# Patient Record
Sex: Female | Born: 1980 | Race: White | Hispanic: No | Marital: Married | Smoking: Never smoker
Health system: Southern US, Community
[De-identification: ages and names within clinical notes are randomized; demographics above are authoritative.]

## PROBLEM LIST (undated history)

## (undated) DIAGNOSIS — K130 Diseases of lips: Secondary | ICD-10-CM

## (undated) DIAGNOSIS — M549 Dorsalgia, unspecified: Secondary | ICD-10-CM

## (undated) DIAGNOSIS — N200 Calculus of kidney: Secondary | ICD-10-CM

## (undated) DIAGNOSIS — Z8669 Personal history of other diseases of the nervous system and sense organs: Secondary | ICD-10-CM

## (undated) HISTORY — DX: Calculus of kidney: N20.0

## (undated) HISTORY — PX: PARTIAL HYSTERECTOMY: SHX80

## (undated) HISTORY — DX: Dorsalgia, unspecified: M54.9

## (undated) HISTORY — DX: Diseases of lips: K13.0

## (undated) HISTORY — PX: ABDOMINAL HYSTERECTOMY: SHX81

## (undated) HISTORY — PX: BREAST ENHANCEMENT SURGERY: SHX7

## (undated) HISTORY — DX: Personal history of other diseases of the nervous system and sense organs: Z86.69

---

## 2009-12-03 ENCOUNTER — Encounter (INDEPENDENT_AMBULATORY_CARE_PROVIDER_SITE_OTHER): Payer: Self-pay | Admitting: Obstetrics and Gynecology

## 2009-12-03 ENCOUNTER — Observation Stay (HOSPITAL_COMMUNITY): Admission: RE | Admit: 2009-12-03 | Discharge: 2009-12-04 | Payer: Self-pay | Admitting: Obstetrics and Gynecology

## 2010-01-20 ENCOUNTER — Encounter: Admission: RE | Admit: 2010-01-20 | Discharge: 2010-01-20 | Payer: Self-pay | Admitting: Obstetrics and Gynecology

## 2010-08-24 LAB — BASIC METABOLIC PANEL
CO2: 29 mEq/L (ref 19–32)
Chloride: 106 mEq/L (ref 96–112)
GFR calc Af Amer: >60 mL/min (ref >60)
GFR calc non Af Amer: >60 mL/min (ref >60)
Glucose, Bld: 92 mg/dL (ref 70–99)
Sodium: 139 mEq/L (ref 135–145)

## 2010-08-24 LAB — CBC
HCT: 28.8 % — ABNORMAL LOW (ref 36.0–46.0)
Hemoglobin: 9.8 g/dL — ABNORMAL LOW (ref 12.0–15.0)
MCH: 27 pg (ref 26.0–34.0)
MCHC: 34.2 g/dL (ref 30.0–36.0)
MCV: 79.6 fL (ref 78.0–100.0)

## 2010-08-24 LAB — CREATININE, SERUM
Creatinine, Ser: 0.61 mg/dL (ref 0.4–1.2)
GFR calc Af Amer: >60 mL/min (ref >60)
GFR calc non Af Amer: >60 mL/min (ref >60)

## 2010-08-24 LAB — SURGICAL PCR SCREEN: Staphylococcus aureus: NEGATIVE

## 2010-08-24 LAB — SAMPLE TO BLOOD BANK

## 2019-03-23 ENCOUNTER — Other Ambulatory Visit: Payer: Self-pay | Admitting: Internal Medicine

## 2019-03-23 DIAGNOSIS — M545 Low back pain, unspecified: Secondary | ICD-10-CM

## 2019-03-30 ENCOUNTER — Ambulatory Visit
Admission: RE | Admit: 2019-03-30 | Discharge: 2019-03-30 | Disposition: A | Discharge status: Home / Self Care | Payer: BC Managed Care – PPO | Source: Ambulatory Visit | Attending: Internal Medicine | Admitting: Internal Medicine

## 2019-03-30 ENCOUNTER — Other Ambulatory Visit: Payer: Self-pay

## 2019-03-30 DIAGNOSIS — M545 Low back pain, unspecified: Secondary | ICD-10-CM

## 2019-09-04 ENCOUNTER — Other Ambulatory Visit: Payer: Self-pay | Admitting: Orthopedic Surgery

## 2019-09-04 DIAGNOSIS — M259 Joint disorder, unspecified: Secondary | ICD-10-CM

## 2019-09-18 ENCOUNTER — Other Ambulatory Visit: Payer: Self-pay

## 2019-09-18 ENCOUNTER — Ambulatory Visit
Admission: RE | Admit: 2019-09-18 | Discharge: 2019-09-18 | Disposition: A | Discharge status: Home / Self Care | Payer: BC Managed Care – PPO | Source: Ambulatory Visit | Attending: Orthopedic Surgery | Admitting: Orthopedic Surgery

## 2019-09-18 DIAGNOSIS — M259 Joint disorder, unspecified: Secondary | ICD-10-CM

## 2019-11-15 ENCOUNTER — Other Ambulatory Visit: Payer: Self-pay | Admitting: Orthopedic Surgery

## 2019-11-15 DIAGNOSIS — M259 Joint disorder, unspecified: Secondary | ICD-10-CM

## 2019-12-01 ENCOUNTER — Ambulatory Visit
Admission: RE | Admit: 2019-12-01 | Discharge: 2019-12-01 | Disposition: A | Discharge status: Home / Self Care | Payer: BC Managed Care – PPO | Source: Ambulatory Visit | Attending: Orthopedic Surgery | Admitting: Orthopedic Surgery

## 2019-12-01 ENCOUNTER — Other Ambulatory Visit: Payer: Self-pay

## 2019-12-01 DIAGNOSIS — M259 Joint disorder, unspecified: Secondary | ICD-10-CM

## 2019-12-01 MED ORDER — METHYLPREDNISOLONE ACETATE 40 MG/ML INJ SUSP (RADIOLOG: 120.0000 mg | Freq: Once | INTRAMUSCULAR | Status: DC

## 2020-03-12 ENCOUNTER — Other Ambulatory Visit: Payer: Self-pay | Admitting: Orthopedic Surgery

## 2020-03-12 DIAGNOSIS — G8929 Other chronic pain: Secondary | ICD-10-CM

## 2020-03-19 ENCOUNTER — Other Ambulatory Visit: Payer: Self-pay

## 2020-03-19 ENCOUNTER — Ambulatory Visit
Admission: RE | Admit: 2020-03-19 | Discharge: 2020-03-19 | Disposition: A | Discharge status: Home / Self Care | Payer: BC Managed Care – PPO | Source: Ambulatory Visit | Attending: Orthopedic Surgery | Admitting: Orthopedic Surgery

## 2020-03-19 DIAGNOSIS — M545 Low back pain, unspecified: Secondary | ICD-10-CM

## 2020-03-19 DIAGNOSIS — G8929 Other chronic pain: Secondary | ICD-10-CM

## 2020-03-19 MED ORDER — IOPAMIDOL (ISOVUE-M 200) INJECTION 41%: 1.0000 mL | Freq: Once | INTRAMUSCULAR | Status: DC

## 2020-03-19 MED ORDER — METHYLPREDNISOLONE ACETATE 40 MG/ML INJ SUSP (RADIOLOG: 60.0000 mg | Freq: Once | INTRAMUSCULAR | Status: DC

## 2020-03-19 MED ORDER — CEFAZOLIN SODIUM-DEXTROSE 2-4 GM/100ML-% IV SOLN
2.0000 g | Freq: Once | INTRAVENOUS | Status: AC
  Administered 2020-03-19: 2 g via INTRAVENOUS

## 2020-03-19 NOTE — Discharge Instructions (Signed)

## 2021-09-03 ENCOUNTER — Ambulatory Visit
Admission: RE | Admit: 2021-09-03 | Discharge: 2021-09-03 | Disposition: A | Discharge status: Home / Self Care | Payer: BC Managed Care – PPO | Source: Ambulatory Visit | Attending: Physical Medicine and Rehabilitation | Admitting: Physical Medicine and Rehabilitation

## 2021-09-03 ENCOUNTER — Other Ambulatory Visit: Payer: Self-pay | Admitting: Physical Medicine and Rehabilitation

## 2021-09-03 DIAGNOSIS — T1490XA Injury, unspecified, initial encounter: Secondary | ICD-10-CM

## 2022-01-10 ENCOUNTER — Other Ambulatory Visit: Payer: Self-pay

## 2022-01-10 ENCOUNTER — Encounter (HOSPITAL_BASED_OUTPATIENT_CLINIC_OR_DEPARTMENT_OTHER): Payer: Self-pay

## 2022-01-10 ENCOUNTER — Emergency Department (HOSPITAL_BASED_OUTPATIENT_CLINIC_OR_DEPARTMENT_OTHER)
Admission: EM | Admit: 2022-01-10 | Discharge: 2022-01-11 | Disposition: A | Discharge status: Home / Self Care | Payer: BC Managed Care – PPO | Attending: Emergency Medicine | Admitting: Emergency Medicine

## 2022-01-10 DIAGNOSIS — M549 Dorsalgia, unspecified: Secondary | ICD-10-CM | POA: Diagnosis not present

## 2022-01-10 DIAGNOSIS — R110 Nausea: Secondary | ICD-10-CM | POA: Insufficient documentation

## 2022-01-10 DIAGNOSIS — R1013 Epigastric pain: Secondary | ICD-10-CM | POA: Insufficient documentation

## 2022-01-10 DIAGNOSIS — R109 Unspecified abdominal pain: Secondary | ICD-10-CM | POA: Diagnosis present

## 2022-01-10 LAB — URINALYSIS, ROUTINE W REFLEX MICROSCOPIC
Bilirubin Urine: NEGATIVE
Glucose, UA: NEGATIVE mg/dL
Ketones, ur: 15 mg/dL — AB
Leukocytes,Ua: NEGATIVE
Nitrite: NEGATIVE
Protein, ur: NEGATIVE mg/dL
Specific Gravity, Urine: 1.013 (ref 1.005–1.030)
pH: 6 (ref 5.0–8.0)

## 2022-01-10 LAB — CBC
HCT: 38.8 % (ref 36.0–46.0)
Hemoglobin: 12.8 g/dL (ref 12.0–15.0)
MCH: 27.9 pg (ref 26.0–34.0)
MCHC: 33 g/dL (ref 30.0–36.0)
MCV: 84.7 fL (ref 80.0–100.0)
Platelets: 348 10*3/uL (ref 150–400)
RBC: 4.58 MIL/uL (ref 3.87–5.11)
RDW: 12.3 % (ref 11.5–15.5)
WBC: 8.9 10*3/uL (ref 4.0–10.5)
nRBC: 0 % (ref 0.0–0.2)

## 2022-01-10 LAB — COMPREHENSIVE METABOLIC PANEL
ALT: 16 U/L (ref 0–44)
AST: 15 U/L (ref 15–41)
Albumin: 4.3 g/dL (ref 3.5–5.0)
Alkaline Phosphatase: 62 U/L (ref 38–126)
Anion gap: 12 (ref 5–15)
BUN: 8 mg/dL (ref 6–20)
CO2: 25 mmol/L (ref 22–32)
Calcium: 9.7 mg/dL (ref 8.9–10.3)
Chloride: 101 mmol/L (ref 98–111)
Creatinine, Ser: 0.54 mg/dL (ref 0.44–1.00)
GFR, Estimated: >60 mL/min (ref >60)
Glucose, Bld: 83 mg/dL (ref 70–99)
Potassium: 3.7 mmol/L (ref 3.5–5.1)
Sodium: 138 mmol/L (ref 135–145)
Total Bilirubin: 0.4 mg/dL (ref 0.3–1.2)
Total Protein: 7.9 g/dL (ref 6.5–8.1)

## 2022-01-10 LAB — LIPASE, BLOOD: Lipase: 29 U/L (ref 11–51)

## 2022-01-10 MED ORDER — FENTANYL CITRATE PF 50 MCG/ML IJ SOSY
50.0000 ug | PREFILLED_SYRINGE | Freq: Once | INTRAMUSCULAR | Status: AC
  Administered 2022-01-11: 50 ug via INTRAVENOUS
  Filled 2022-01-10: qty 1

## 2022-01-10 MED ORDER — ONDANSETRON HCL 4 MG/2ML IJ SOLN
4.0000 mg | Freq: Once | INTRAMUSCULAR | Status: AC
  Administered 2022-01-11: 4 mg via INTRAVENOUS
  Filled 2022-01-10: qty 2

## 2022-01-10 NOTE — ED Triage Notes (Signed)
Pt presents to the ED with mid to upper abdominal pain x7 days. Reports nausea. Denies vomiting or diarrhea. Reports recently getting back from [place] where she spent two months.

## 2022-01-10 NOTE — ED Provider Notes (Signed)
[facility] Provider Note: [name], MD, FACEP  CSN: [id number] MRN: [id number] ARRIVAL: 01/10/22 at 1930 ROOM: DB014/DB014   CHIEF COMPLAINT  Abdominal Pain   HISTORY OF PRESENT ILLNESS  01/10/22 11:47 PM Cindy Harmon is a 41 y.o. female with 1 week of mid upper abdominal pain which she rates as a 6 out of 10.  She has had associated nausea but no vomiting or diarrhea.  She recently returned from [place] where she is spent 2 months.  She is also having pain in her back due to a known disc problem.   History reviewed. No pertinent past medical history.  Past Surgical History:  Procedure Laterality Date   ABDOMINAL HYSTERECTOMY      No family history on file.  Social History   Tobacco Use   Smoking status: Never   Smokeless tobacco: Never  Substance Use Topics   Alcohol use: Never   Drug use: Never    Prior to Admission medications   Medication Sig Start Date End Date Taking? Authorizing Provider  ondansetron (ZOFRAN) 8 MG tablet Take 1 tablet (8 mg total) by mouth every 8 (eight) hours as needed for nausea or vomiting. 01/11/22  Yes Andri Prestia, MD  pantoprazole (PROTONIX) 40 MG tablet Take 1 tablet every morning at least 30 minutes before first dose of Carafate. 01/11/22  Yes Cresencia Asmus, MD  sucralfate (CARAFATE) 1 g tablet Take 1 tablet (1 g total) by mouth 4 (four) times daily -  with meals and at bedtime. 01/11/22  Yes Moksh Loomer, MD    Allergies Pollen extract   REVIEW OF SYSTEMS  Negative except as noted here or in the History of Present Illness.   PHYSICAL EXAMINATION  Initial Vital Signs Blood pressure 114/80, pulse 79, temperature 98.3 F (36.8 C), temperature source Oral, resp. rate 20, height 4\' 11"  (1.499 m), weight 49.9 kg, SpO2 96 %.  Examination General: Well-developed, well-nourished female in no acute distress; appearance consistent with age of record HENT: normocephalic; atraumatic Eyes: Normal appearance Neck:  supple Heart: regular rate and rhythm Lungs: clear to auscultation bilaterally Abdomen: soft; nondistended; epigastric and suprapubic tenderness; bowel sounds present Extremities: No deformity; full range of motion; pulses normal Neurologic: Awake, alert and oriented; motor function intact in all extremities and symmetric; no facial droop Skin: Warm and dry Psychiatric: Normal mood and affect   RESULTS  Summary of this visit's results, reviewed and interpreted by myself:   EKG Interpretation  Date/Time:    Ventricular Rate:    PR Interval:    QRS Duration:   QT Interval:    QTC Calculation:   R Axis:     Text Interpretation:         Laboratory Studies: Results for orders placed or performed during the hospital encounter of 01/10/22 (from the past 24 hour(s))  Lipase, blood     Status: None   Collection Time: 01/10/22  7:51 PM  Result Value Ref Range   Lipase 29 11 - 51 U/L  Comprehensive metabolic panel     Status: None   Collection Time: 01/10/22  7:51 PM  Result Value Ref Range   Sodium 138 135 - 145 mmol/L   Potassium 3.7 3.5 - 5.1 mmol/L   Chloride 101 98 - 111 mmol/L   CO2 25 22 - 32 mmol/L   Glucose, Bld 83 70 - 99 mg/dL   BUN 8 6 - 20 mg/dL   Creatinine, Ser 03/12/22 0.44 - 1.00 mg/dL   Calcium  9.7 8.9 - 10.3 mg/dL   Total Protein 7.9 6.5 - 8.1 g/dL   Albumin 4.3 3.5 - 5.0 g/dL   AST 15 15 - 41 U/L   ALT 16 0 - 44 U/L   Alkaline Phosphatase 62 38 - 126 U/L   Total Bilirubin 0.4 0.3 - 1.2 mg/dL   GFR, Estimated >53 >64 mL/min   Anion gap 12 5 - 15  CBC     Status: None   Collection Time: 01/10/22  7:51 PM  Result Value Ref Range   WBC 8.9 4.0 - 10.5 K/uL   RBC 4.58 3.87 - 5.11 MIL/uL   Hemoglobin 12.8 12.0 - 15.0 g/dL   HCT 68.0 32.1 - 22.4 %   MCV 84.7 80.0 - 100.0 fL   MCH 27.9 26.0 - 34.0 pg   MCHC 33.0 30.0 - 36.0 g/dL   RDW 82.5 00.3 - 70.4 %   Platelets 348 150 - 400 K/uL   nRBC 0.0 0.0 - 0.2 %  Urinalysis, Routine w reflex microscopic      Status: Abnormal   Collection Time: 01/10/22  9:00 PM  Result Value Ref Range   Color, Urine COLORLESS (A) YELLOW   APPearance CLEAR CLEAR   Specific Gravity, Urine 1.013 1.005 - 1.030   pH 6.0 5.0 - 8.0   Glucose, UA NEGATIVE NEGATIVE mg/dL   Hgb urine dipstick SMALL (A) NEGATIVE   Bilirubin Urine NEGATIVE NEGATIVE   Ketones, ur 15 (A) NEGATIVE mg/dL   Protein, ur NEGATIVE NEGATIVE mg/dL   Nitrite NEGATIVE NEGATIVE   Leukocytes,Ua NEGATIVE NEGATIVE   RBC / HPF 0-5 0 - 5 RBC/hpf   WBC, UA 0-5 0 - 5 WBC/hpf   Squamous Epithelial / LPF 0-5 0 - 5   Mucus PRESENT    Imaging Studies: CT ABDOMEN PELVIS W CONTRAST  Result Date: 01/11/2022 CLINICAL DATA:  Acute abdominal pain. EXAM: CT ABDOMEN AND PELVIS WITH CONTRAST TECHNIQUE: Multidetector CT imaging of the abdomen and pelvis was performed using the standard protocol following bolus administration of intravenous contrast. RADIATION DOSE REDUCTION: This exam was performed according to the departmental dose-optimization program which includes automated exposure control, adjustment of the mA and/or kV according to patient size and/or use of iterative reconstruction technique. CONTRAST:  82mL OMNIPAQUE IOHEXOL 300 MG/ML  SOLN COMPARISON:  Remote noncontrast CT 01/20/2010 FINDINGS: Lower chest: Clear lung bases. Hepatobiliary: Tiny subcentimeter hypodensities in the dome of the liver are too small to characterize. No suspicious liver lesion. Physiologically distended gallbladder without calcified gallstone. Upper normal common bile duct at 7 mm. No visible choledocholithiasis. There is no intrahepatic biliary ductal dilatation. Pancreas: No ductal dilatation or inflammation. Spleen: Normal in size without focal abnormality. Adrenals/Urinary Tract: Normal adrenal glands. No hydronephrosis. There are 3 nonobstructing stones in the left kidney. No definite right renal calculi. No focal renal lesion. No renal inflammation. Unremarkable urinary bladder.  Stomach/Bowel: Decompressed stomach. No small bowel obstruction or inflammation. Normal appendix. Moderate volume of stool in the ascending and transverse colon. The descending colon is decompressed, equivocal mild pericolonic edema. Formed stool in the sigmoid. Vascular/Lymphatic: No acute vascular findings. Normal caliber abdominal aorta. Patent portal vein. No abdominopelvic adenopathy. Reproductive: Hysterectomy.  Quiescent ovaries, no adnexal mass. Other: No free air, free fluid, or intra-abdominal fluid collection. Musculoskeletal: There are no acute or suspicious osseous abnormalities. IMPRESSION: 1. Equivocal mild pericolonic edema of the descending colon, query mild colitis. 2. Nonobstructing left nephrolithiasis. Electronically Signed   By: Ivette Loyal.D.  On: 01/11/2022 00:54    ED COURSE and MDM  Nursing notes, initial and subsequent vitals signs, including pulse oximetry, reviewed and interpreted by myself.  Vitals:   01/10/22 2300 01/10/22 2330 01/11/22 0000 01/11/22 0028  BP: 113/80 90/64 98/63    Pulse: 86 77 77   Resp:      Temp:    98.5 F (36.9 C)  TempSrc:      SpO2: 98% 98% 96%   Weight:      Height:       Medications  pantoprazole (PROTONIX) injection 40 mg (has no administration in time range)  sucralfate (CARAFATE) 1 GM/10ML suspension 1 g (has no administration in time range)  ondansetron (ZOFRAN) injection 4 mg (4 mg Intravenous Given 01/11/22 0031)  fentaNYL (SUBLIMAZE) injection 50 mcg (50 mcg Intravenous Given 01/11/22 0031)  iohexol (OMNIPAQUE) 300 MG/ML solution 100 mL (80 mLs Intravenous Contrast Given 01/11/22 0026)   1:00 AM The CT scan shows some equivocal thickening of the descending colon.  I doubt a diagnosis of colitis given lack of diarrhea or bowel changes.  Her symptoms are primarily epigastric and worse at night.  I think gastritis and/or GERD are a more likely etiology.  We will start her on a PPI and Carafate.  She has a primary care physician  with whom she can follow-up.  She was advised to return to the ED should she develop diarrhea especially bloody diarrhea.   PROCEDURES  Procedures   ED DIAGNOSES     ICD-10-CM   1. Epigastric pain  R10.13          Colby Catanese, Jonny Ruiz, MD 01/11/22 [phone]

## 2022-01-11 ENCOUNTER — Emergency Department (HOSPITAL_BASED_OUTPATIENT_CLINIC_OR_DEPARTMENT_OTHER): Payer: BC Managed Care – PPO

## 2022-01-11 MED ORDER — IOHEXOL 300 MG/ML  SOLN
100.0000 mL | Freq: Once | INTRAMUSCULAR | Status: AC | PRN
  Administered 2022-01-11: 80 mL via INTRAVENOUS

## 2022-01-11 MED ORDER — SUCRALFATE 1 G PO TABS: 1.0000 g | ORAL_TABLET | Freq: Three times a day (TID) | ORAL | 0 refills | Status: DC

## 2022-01-11 MED ORDER — PANTOPRAZOLE SODIUM 40 MG PO TBEC: DELAYED_RELEASE_TABLET | ORAL | 0 refills | Status: DC

## 2022-01-11 MED ORDER — PANTOPRAZOLE SODIUM 40 MG IV SOLR
40.0000 mg | Freq: Once | INTRAVENOUS | Status: AC
  Administered 2022-01-11: 40 mg via INTRAVENOUS
  Filled 2022-01-11: qty 10

## 2022-01-11 MED ORDER — METOCLOPRAMIDE HCL 5 MG/ML IJ SOLN
10.0000 mg | Freq: Once | INTRAMUSCULAR | Status: AC
  Administered 2022-01-11: 10 mg via INTRAVENOUS
  Filled 2022-01-11: qty 2

## 2022-01-11 MED ORDER — ONDANSETRON HCL 8 MG PO TABS: 8.0000 mg | ORAL_TABLET | Freq: Three times a day (TID) | ORAL | 0 refills | Status: DC | PRN

## 2022-01-11 MED ORDER — SUCRALFATE 1 GM/10ML PO SUSP
1.0000 g | Freq: Once | ORAL | Status: AC
Start: 2022-01-11 — End: 2022-01-11
  Administered 2022-01-11: 1 g via ORAL
  Filled 2022-01-11: qty 10

## 2023-04-16 ENCOUNTER — Other Ambulatory Visit: Payer: Self-pay | Admitting: Adult Health

## 2023-04-16 DIAGNOSIS — R42 Dizziness and giddiness: Secondary | ICD-10-CM

## 2023-04-16 DIAGNOSIS — R519 Headache, unspecified: Secondary | ICD-10-CM

## 2023-04-19 ENCOUNTER — Inpatient Hospital Stay
Admission: RE | Admit: 2023-04-19 | Discharge: 2023-04-19 | Discharge status: Home / Self Care | Payer: BC Managed Care – PPO | Source: Ambulatory Visit | Attending: Adult Health

## 2023-04-19 DIAGNOSIS — R42 Dizziness and giddiness: Secondary | ICD-10-CM

## 2023-04-19 DIAGNOSIS — R519 Headache, unspecified: Secondary | ICD-10-CM

## 2023-04-19 MED ORDER — GADOPICLENOL 0.5 MMOL/ML IV SOLN
7.5000 mL | Freq: Once | INTRAVENOUS | Status: AC | PRN
  Administered 2023-04-19: 6 mL via INTRAVENOUS

## 2023-04-20 NOTE — Progress Notes (Unsigned)
[facility]  PATIENT: Cindy Harmon DOB: March 02, 1981  REFERRING DOCTOR OR PCP:  *** SOURCE: ***  _________________________________   HISTORICAL  CHIEF COMPLAINT:  No chief complaint on file.   HISTORY OF PRESENT ILLNESS:  ***   Imaging: MRI of the brain 04/19/2023 was normal for age with just a couple punctate T2/FLAIR hyperintense foci in the subcortical or deep white matter of the frontal lobes.  Normal enhancement pattern.  REVIEW OF SYSTEMS: Constitutional: No fevers, chills, sweats, or change in appetite Eyes: No visual changes, double vision, eye pain Ear, nose and throat: No hearing loss, ear pain, nasal congestion, sore throat Cardiovascular: No chest pain, palpitations Respiratory:  No shortness of breath at rest or with exertion.   No wheezes GastrointestinaI: No nausea, vomiting, diarrhea, abdominal pain, fecal incontinence Genitourinary:  No dysuria, urinary retention or frequency.  No nocturia. Musculoskeletal:  No neck pain, back pain Integumentary: No rash, pruritus, skin lesions Neurological: as above Psychiatric: No depression at this time.  No anxiety Endocrine: No palpitations, diaphoresis, change in appetite, change in weigh or increased thirst Hematologic/Lymphatic:  No anemia, purpura, petechiae. Allergic/Immunologic: No itchy/runny eyes, nasal congestion, recent allergic reactions, rashes  ALLERGIES: Allergies  Allergen Reactions   Pollen Extract Other (See Comments)    HOME MEDICATIONS:  Current Outpatient Medications:    ondansetron (ZOFRAN) 8 MG tablet, Take 1 tablet (8 mg total) by mouth every 8 (eight) hours as needed for nausea or vomiting., Disp: 10 tablet, Rfl: 0   pantoprazole (PROTONIX) 40 MG tablet, Take 1 tablet every morning at least 30 minutes before first dose of Carafate., Disp: 30 tablet, Rfl: 0   sucralfate (CARAFATE) 1 g tablet, Take 1 tablet (1 g total) by mouth 4 (four) times daily -  with meals and  at bedtime., Disp: 40 tablet, Rfl: 0  PAST MEDICAL HISTORY: No past medical history on file.  PAST SURGICAL HISTORY: Past Surgical History:  Procedure Laterality Date   ABDOMINAL HYSTERECTOMY      FAMILY HISTORY: No family history on file.  SOCIAL HISTORY: Social History   Socioeconomic History   Marital status: Married    Spouse name: Not on file   Number of children: Not on file   Years of education: Not on file   Highest education level: Not on file  Occupational History   Not on file  Tobacco Use   Smoking status: Never   Smokeless tobacco: Never  Substance and Sexual Activity   Alcohol use: Never   Drug use: Never   Sexual activity: Not on file  Other Topics Concern   Not on file  Social History Narrative   Not on file   Social Determinants of Health   Financial Resource Strain: Not on file  Food Insecurity: Not on file  Transportation Needs: Not on file  Physical Activity: Not on file  Stress: Not on file  Social Connections: Not on file  Intimate Partner Violence: Not on file       PHYSICAL EXAM  There were no vitals filed for this visit.  There is no height or weight on file to calculate BMI.   General: The patient is well-developed and well-nourished and in no acute distress  HEENT:  Head is Anza/AT.  Sclera are anicteric.  Funduscopic exam shows normal optic discs and retinal vessels.  Neck: No carotid bruits are noted.  The neck is nontender.  Cardiovascular: The heart has a regular rate and rhythm with a normal S1 and S2.  There were no murmurs, gallops or rubs.    Skin: Extremities are without rash or  edema.  Musculoskeletal:  Back is nontender  Neurologic Exam  Mental status: The patient is alert and oriented x 3 at the time of the examination. The patient has apparent normal recent and remote memory, with an apparently normal attention span and concentration ability.   Speech is normal.  Cranial nerves: Extraocular movements are  full. Pupils are equal, round, and reactive to light and accomodation.  Visual fields are full.  Facial symmetry is present. There is good facial sensation to soft touch bilaterally.Facial strength is normal.  Trapezius and sternocleidomastoid strength is normal. No dysarthria is noted.  The tongue is midline, and the patient has symmetric elevation of the soft palate. No obvious hearing deficits are noted.  Motor:  Muscle bulk is normal.   Tone is normal. Strength is  5 / 5 in all 4 extremities.   Sensory: Sensory testing is intact to pinprick, soft touch and vibration sensation in all 4 extremities.  Coordination: Cerebellar testing reveals good finger-nose-finger and heel-to-shin bilaterally.  Gait and station: Station is normal.   Gait is normal. Tandem gait is normal. Romberg is negative.   Reflexes: Deep tendon reflexes are symmetric and normal bilaterally.   Plantar responses are flexor.    DIAGNOSTIC DATA (LABS, IMAGING, TESTING) - I reviewed patient records, labs, notes, testing and imaging myself where available.  Lab Results  Component Value Date   WBC 8.9 01/10/2022   HGB 12.8 01/10/2022   HCT 38.8 01/10/2022   MCV 84.7 01/10/2022   PLT 348 01/10/2022      Component Value Date/Time   NA 138 01/10/2022 1951   K 3.7 01/10/2022 1951   CL 101 01/10/2022 1951   CO2 25 01/10/2022 1951   GLUCOSE 83 01/10/2022 1951   BUN 8 01/10/2022 1951   CREATININE 0.54 01/10/2022 1951   CALCIUM 9.7 01/10/2022 1951   PROT 7.9 01/10/2022 1951   ALBUMIN 4.3 01/10/2022 1951   AST 15 01/10/2022 1951   ALT 16 01/10/2022 1951   ALKPHOS 62 01/10/2022 1951   BILITOT 0.4 01/10/2022 1951   GFRNONAA >60 01/10/2022 1951   GFRAA  12/04/2009 0500    >60        The eGFR has been calculated using the MDRD equation. This calculation has not been validated in all clinical situations. eGFR's persistently <60 mL/min signify possible Chronic Kidney Disease.   No results found for: "CHOL",  "HDL", "LDLCALC", "LDLDIRECT", "TRIG", "CHOLHDL" No results found for: "HGBA1C" No results found for: "VITAMINB12" No results found for: "TSH"     ASSESSMENT AND PLAN  ***   Cindy Harmon A. Epimenio Foot, MD, [facility] 04/20/2023, 9:21 PM Certified in Neurology, Clinical Neurophysiology, Sleep Medicine and Neuroimaging  [facility] [address], [address] [place], [place] [address] [phone]

## 2023-04-22 ENCOUNTER — Encounter: Payer: Self-pay | Admitting: Neurology

## 2023-04-22 ENCOUNTER — Ambulatory Visit: Payer: BC Managed Care – PPO | Admitting: Neurology

## 2023-04-22 ENCOUNTER — Telehealth: Payer: Self-pay | Admitting: Neurology

## 2023-04-22 VITALS — Ht 59.5 in | Wt 125.8 lb

## 2023-04-22 DIAGNOSIS — G43709 Chronic migraine without aura, not intractable, without status migrainosus: Secondary | ICD-10-CM

## 2023-04-22 DIAGNOSIS — M542 Cervicalgia: Secondary | ICD-10-CM | POA: Diagnosis not present

## 2023-04-22 MED ORDER — AJOVY 225 MG/1.5ML ~~LOC~~ SOAJ: SUBCUTANEOUS | 3 refills | Status: DC

## 2023-04-22 NOTE — Telephone Encounter (Signed)
I called pt and relayed that after speaking to Dr. Epimenio Foot that the marcaine is numbing and will last 6-10 hrs and should be gone in morning. She verbalized understanding.  She is ok to take tylenol pm at bedtime.

## 2023-04-22 NOTE — Telephone Encounter (Signed)
Pt is asking for a call to discuss that where she was given 2 injections the left side of the back of her neck is numb.  Pt is asking if this is normal, she would like a call to discuss.

## 2023-04-22 NOTE — Telephone Encounter (Signed)
I called pt. She had trigger point injection this am. She says that the left side of her head is numb, She is not having any issues with breathing, swallowing.  She is not freaking out.  She states she just feels  weird.  This is about 1-1.5 hr after getting injection.  She did take ajovy injection about 1130.   She says there is pain at injection site, (not bad), but just the weird feeling numbness on L side head.  I told her she did get steroid and marcaine (numbing medication).  I told will let Dr. Epimenio Foot know and then get back to her.

## 2023-05-04 ENCOUNTER — Telehealth: Payer: Self-pay | Admitting: Neurology

## 2023-05-04 DIAGNOSIS — G43709 Chronic migraine without aura, not intractable, without status migrainosus: Secondary | ICD-10-CM

## 2023-05-04 NOTE — Telephone Encounter (Signed)
[facility] ([facility]) Want to make sure your office is aware Fremanezumab-vfrm (AJOVY) 225 MG/1.5ML SOAJ need a PA.  Pt was at our office yesterday and mentioned pharmacy informed her that medication needs a PA.

## 2023-05-04 NOTE — Telephone Encounter (Signed)
Patient said Ajovy needs PA.

## 2023-05-04 NOTE — Telephone Encounter (Signed)
Pt's spouse has called to provide Dr Epimenio Foot with the name of medications that have not helped pt: Zonisamide, Promethazine and Imipramine HCL, he is available for a call if this needs to be discussed.

## 2023-05-05 ENCOUNTER — Other Ambulatory Visit (HOSPITAL_COMMUNITY): Payer: Self-pay

## 2023-05-05 ENCOUNTER — Telehealth: Payer: Self-pay

## 2023-05-05 NOTE — Telephone Encounter (Signed)
*  [facility]  Pharmacy Patient Advocate Encounter   Received notification from CoverMyMeds that prior authorization for AJOVY (fremanezumab-vfrm) injection 225MG /1.5ML auto-injectors  is required/requested.   Insurance verification completed.   The patient is insured through [facility] .   Per test claim: PA required; PA started via CoverMyMeds. KEY [id number] . Waiting for clinical questions to populate.

## 2023-05-05 NOTE — Telephone Encounter (Signed)
Pharmacy Patient Advocate Encounter  Received notification from [facility] that Prior Authorization for AJOVY (fremanezumab-vfrm) injection 225MG /1.5ML auto-injectors has been APPROVED from 05/05/2023 to 08/05/2023. Ran test claim, Copay is $24.98 for a 3 month supply. This test claim was processed through [facility]- copay amounts may vary at other pharmacies due to pharmacy/plan contracts, or as the patient moves through the different stages of their insurance plan.   PA #/Case ID/Reference #: [id number]

## 2023-05-05 NOTE — Telephone Encounter (Signed)
PA request has been Submitted. New Encounter created for follow up. For additional info see Pharmacy Prior Auth telephone encounter from 11/27.

## 2023-05-11 MED ORDER — UBRELVY 100 MG PO TABS: ORAL_TABLET | ORAL | 5 refills | Status: DC

## 2023-05-11 NOTE — Telephone Encounter (Signed)
The husband just called again and said he got a call that the Ajovy was approved and wanted to make sure it gets called into their pharmacy please.

## 2023-05-11 NOTE — Telephone Encounter (Signed)
Called pt husband. He confirmed wife started Ajovy and tolerating well. She has noticed benefit.   He also states Dr. Epimenio Foot gave sample of Ubrelvy at last visit to take prn for migraines. This worked well for her and she would like rx called in for this. Aware some insurances will not cover both Ajovy/Ubrelvy but we will try. Rx called into pharmacy.

## 2023-05-11 NOTE — Telephone Encounter (Signed)
Rx Ajovy already sent in on 04/22/23 by Dr. Epimenio Foot.

## 2023-05-13 ENCOUNTER — Telehealth: Payer: Self-pay

## 2023-05-13 ENCOUNTER — Other Ambulatory Visit (HOSPITAL_COMMUNITY): Payer: Self-pay

## 2023-05-13 NOTE — Telephone Encounter (Signed)
Pharmacy Patient Advocate Encounter   Received notification from CoverMyMeds that prior authorization for Ubrelvy 100MG  tablets is required/requested.   Insurance verification completed.   The patient is insured through [facility] .   Per test claim: PA required; PA submitted to above mentioned insurance via CoverMyMeds Key/confirmation #/EOC [id number] Status is pending

## 2023-05-14 ENCOUNTER — Other Ambulatory Visit: Payer: Self-pay | Admitting: Neurology

## 2023-05-14 DIAGNOSIS — G43709 Chronic migraine without aura, not intractable, without status migrainosus: Secondary | ICD-10-CM

## 2023-05-17 ENCOUNTER — Other Ambulatory Visit: Payer: Self-pay | Admitting: Neurology

## 2023-05-17 DIAGNOSIS — G43709 Chronic migraine without aura, not intractable, without status migrainosus: Secondary | ICD-10-CM

## 2023-05-17 NOTE — Telephone Encounter (Signed)
Prior Auth team started PA on 05/13/23, status pending

## 2023-05-18 NOTE — Telephone Encounter (Signed)
PA status: pending
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
## 2023-05-24 NOTE — Telephone Encounter (Signed)
Looks like that when seen her meds tried, (she had taken nurtec),  No triptans.  Ubrevly denied need to try 2 triptans,   They want pt to try rizatriptan are you ok to try this?

## 2023-05-24 NOTE — Telephone Encounter (Signed)
Pharmacy Patient Advocate Encounter  Received notification from [facility] that Prior Authorization for Ubrelvy 100mg  has been DENIED.  Full denial letter will be uploaded to the media tab. See denial reason below.   PA #/Case ID/Reference #: [id number]

## 2023-05-24 NOTE — Addendum Note (Signed)
Addended by: Guy Begin on: 05/24/2023 01:14 PM   Modules accepted: Orders

## 2023-05-24 NOTE — Telephone Encounter (Addendum)
Pt's Cindy Harmon (on [place]) said was denied cover for [place] by insurance due to have not Rizatriptan. Sharman Cheek worked really well  for her. Would like a call  back to discuss trying Rizatriptan.

## 2023-05-25 MED ORDER — RIZATRIPTAN BENZOATE 10 MG PO TABS: ORAL_TABLET | ORAL | 5 refills | Status: DC

## 2023-05-25 NOTE — Telephone Encounter (Signed)
Error

## 2023-05-25 NOTE — Addendum Note (Signed)
Addended by: Hermenia Fiscal S on: 05/25/2023 12:07 PM   Modules accepted: Orders

## 2023-05-25 NOTE — Telephone Encounter (Signed)
Per Dr. Epimenio Foot:  Molli Knock to try rizatriptan 10 mg as needed     #10 with 5 refills    1 p.o. as needed migraine, may repeat in 2 hours if needed.  No more than 2 in 1 day or 4 in 1 week. Order placed. I called pt and spoke to husband.  I relayed the plan.  Instructions given.  He verbalized understanding.  He will call back if questions.  Pt has not been on any triptans.  I relayed to read SE from pharmacy when received.  He stated they will.

## 2023-08-16 ENCOUNTER — Encounter: Payer: Self-pay | Admitting: Neurology

## 2023-08-16 ENCOUNTER — Ambulatory Visit: Payer: BC Managed Care – PPO | Admitting: Neurology

## 2023-08-16 VITALS — BP 120/75 | HR 91 | Ht 59.5 in | Wt 125.9 lb

## 2023-08-16 DIAGNOSIS — M542 Cervicalgia: Secondary | ICD-10-CM | POA: Diagnosis not present

## 2023-08-16 DIAGNOSIS — G43709 Chronic migraine without aura, not intractable, without status migrainosus: Secondary | ICD-10-CM | POA: Diagnosis not present

## 2023-08-16 DIAGNOSIS — G44221 Chronic tension-type headache, intractable: Secondary | ICD-10-CM

## 2023-08-16 MED ORDER — NURTEC 75 MG PO TBDP: ORAL_TABLET | ORAL | 5 refills | Status: AC

## 2023-08-16 MED ORDER — TIZANIDINE HCL 2 MG PO TABS: ORAL_TABLET | ORAL | 5 refills | Status: DC

## 2023-08-16 MED ORDER — EMGALITY 120 MG/ML ~~LOC~~ SOAJ: 1.0000 | SUBCUTANEOUS | 4 refills | Status: AC

## 2023-08-16 NOTE — Progress Notes (Signed)
 [facility]  PATIENT: Cindy Harmon DOB: February 20, 1981  REFERRING DOCTOR OR PCP:   Ronney Lion, NP SOURCE: Patient, notes from primary care, imaging reports, MRI images personally reviewed.  _________________________________   HISTORICAL  CHIEF COMPLAINT:  Chief Complaint  Patient presents with   Migraine    Rm10, husband present, Migraine:0/30 days, headaches: 7/30 days and that's the week before next migraine injection (ajovy) is to be taken.  Ajovy causes rash. neck pain: occurs right before getting migraine. Maxalt makes her nauseous    HISTORY OF PRESENT ILLNESS:  [name] is a 43 yo woman with chronic migraine and tension type headaches.   Update 08/16/2023 She is doing the Ajovy shots every month and is much better x 2 weeks and then HA returns the last 2 weeks (though less intense than before the Ajovy).   Rizatriptan makes her feel sick but she feels worse when she takes it .   With the exception of the first shot, she has had skin reaction with the Ajovy and we discussed other options..    In general she feels much better as the migraines are milder when they occur.     Currently, besides the intermittent headaches with migrainous characteristics she has had pain in the occiput bilaterally radiating to the back of the head consistent with a superimposed chronic tension headache   Headache history: She has had migraine headaches since age 18.    When I first saw her in 2024, she was having daily headache, for > 4 hours.  She usually has nausea but not vomiting.    She has photophobia and phonophobia.  Before HA were daily, she would still have migraines 20+ days most months.      When she has a headache she lays down with a cooling device for her head.  She takes a Tylenol PM and tries to fall asleep.  Sometimes she is better after waking up.   If she wakes up with a HA, she takes Tylenol and a muscle relaxant.     When HA is more severe she  gets a lot of eye pain.   Sometimes HA's start in her neck.  The migraines that follow are often more severe.  She has episodes with dizziness (vertigo and lightheaded).  These occur in any position.  Sometimes these seem to be associated with the headaches and other times not  Medications tried: Prophylactive:  She was on Depakote without benefit.   Botox injections had not helped.  She was once on an antidepressant for migraine that did not help (she will check her records to let us know which one).  Tizanidine helps sleep but not pain.  Treatment:  NSAIDs, Tylenol do not help much.  A steroid helped helped some.  Shas not tried a triptan.  She tried an anti CGRP (Nurtec she thinks).       Imaging: MRI of the brain 04/19/2023 was normal for age with just a couple punctate T2/FLAIR hyperintense foci in the subcortical or deep white matter of the frontal lobes.  Normal enhancement pattern.  REVIEW OF SYSTEMS: Constitutional: No fevers, chills, sweats, or change in appetite Eyes: No visual changes, double vision, eye pain Ear, nose and throat: No hearing loss, ear pain, nasal congestion, sore throat Cardiovascular: No chest pain, palpitations Respiratory:  No shortness of breath at rest or with exertion.   No wheezes GastrointestinaI: No nausea, vomiting, diarrhea, abdominal pain, fecal incontinence Genitourinary:  No dysuria, urinary retention  or frequency.  No nocturia. Musculoskeletal:  No neck pain, back pain Integumentary: No rash, pruritus, skin lesions Neurological: as above Psychiatric: No depression at this time.  No anxiety Endocrine: No palpitations, diaphoresis, change in appetite, change in weigh or increased thirst Hematologic/Lymphatic:  No anemia, purpura, petechiae. Allergic/Immunologic: No itchy/runny eyes, nasal congestion, recent allergic reactions, rashes  ALLERGIES: Allergies  Allergen Reactions   Bee Pollen     Other Reaction(s): Other (See Comments)   Pollen  Extract Other (See Comments)    Other Reaction(s): headache   Maxalt [Rizatriptan] Nausea Only   Propofol     Other Reaction(s): she states intolerant   Ajovy [Fremanezumab-Vfrm] Rash    HOME MEDICATIONS:  Current Outpatient Medications:    Acetaminophen (TYLENOL 8 HOUR PO), Take 500 mg by mouth at bedtime., Disp: , Rfl:    Galcanezumab-gnlm (EMGALITY) 120 MG/ML SOAJ, Inject 1 Pen into the skin every 28 (twenty-eight) days., Disp: 3 mL, Rfl: 4   progesterone (PROMETRIUM) 200 MG capsule, Take 200 mg by mouth daily., Disp: , Rfl:    Rimegepant Sulfate (NURTEC) 75 MG TBDP, One po every day prn migraine., Disp: 10 tablet, Rfl: 5   tiZANidine (ZANAFLEX) 2 MG tablet, One or two po qHS, Disp: 60 tablet, Rfl: 5  PAST MEDICAL HISTORY: Past Medical History:  Diagnosis Date   Angular cheilitis    Back pain    Hx of migraine headaches    Nephrolithiasis     PAST SURGICAL HISTORY: Past Surgical History:  Procedure Laterality Date   ABDOMINAL HYSTERECTOMY     BREAST ENHANCEMENT SURGERY     PARTIAL HYSTERECTOMY      FAMILY HISTORY: Family History  Problem Relation Age of Onset   Lung cancer Father    Alcoholism Brother     SOCIAL HISTORY: Social History   Socioeconomic History   Marital status: Married    Spouse name: Not on file   Number of children: 2   Years of education: Not on file   Highest education level: Not on file  Occupational History   Not on file  Tobacco Use   Smoking status: Never   Smokeless tobacco: Never  Vaping Use   Vaping status: Never Used  Substance and Sexual Activity   Alcohol use: Never   Drug use: Never   Sexual activity: Not on file  Other Topics Concern   Not on file  Social History Narrative   Right handed    Wears glasses    Social Drivers of Corporate investment banker Strain: Not on file  Food Insecurity: Not on file  Transportation Needs: Not on file  Physical Activity: Not on file  Stress: Not on file  Social Connections:  Not on file  Intimate Partner Violence: Not on file       PHYSICAL EXAM  Vitals:   08/16/23 1341  BP: 120/75  Pulse: 91  Weight: 125 lb 14.1 oz (57.1 kg)  Height: 4' 11.5" (1.511 m)    Body mass index is 25 kg/m.   General: The patient is well-developed and well-nourished and in no acute distress  HEENT:  Head is /AT.  Sclera are anicteric.   Skin: Extremities are without rash or  edema.  Musculoskeletal: She has tenderness over the occiput bilaterally.  Lifting the head reduces the pain.  Range of motion was normal in the neck.  Neurologic Exam  Mental status: The patient is alert and oriented x 3 at the time of the examination. The  patient has apparent normal recent and remote memory, with an apparently normal attention span and concentration ability.   Speech is normal.  Cranial nerves: Extraocular movements are full. Pupils are equal, round, and reactive to light and accomodation.  Visual fields are full.  Facial symmetry is present. There is good facial sensation to soft touch bilaterally.Facial strength is normal.  Trapezius and sternocleidomastoid strength is normal. No dysarthria is noted.  No obvious hearing deficits are noted.  Motor:  Muscle bulk is normal.   Tone is normal. Strength is  5 / 5 in all 4 extremities.   Sensory: Sensory testing is intact to pinprick, soft touch and vibration sensation in all 4 extremities.  Coordination: Cerebellar testing reveals good finger-nose-finger and heel-to-shin bilaterally.  Gait and station: Station is normal.   Gait and tandem gait are normal.. Romberg is negative.   Reflexes: Deep tendon reflexes are symmetric and normal bilaterally.        DIAGNOSTIC DATA (LABS, IMAGING, TESTING) - I reviewed patient records, labs, notes, testing and imaging myself where available.  Lab Results  Component Value Date   WBC 8.9 01/10/2022   HGB 12.8 01/10/2022   HCT 38.8 01/10/2022   MCV 84.7 01/10/2022   PLT 348  01/10/2022      Component Value Date/Time   NA 138 01/10/2022 1951   K 3.7 01/10/2022 1951   CL 101 01/10/2022 1951   CO2 25 01/10/2022 1951   GLUCOSE 83 01/10/2022 1951   BUN 8 01/10/2022 1951   CREATININE 0.54 01/10/2022 1951   CALCIUM 9.7 01/10/2022 1951   PROT 7.9 01/10/2022 1951   ALBUMIN 4.3 01/10/2022 1951   AST 15 01/10/2022 1951   ALT 16 01/10/2022 1951   ALKPHOS 62 01/10/2022 1951   BILITOT 0.4 01/10/2022 1951   GFRNONAA >60 01/10/2022 1951   GFRAA  12/04/2009 0500    >60        The eGFR has been calculated using the MDRD equation. This calculation has not been validated in all clinical situations. eGFR's persistently <60 mL/min signify possible Chronic Kidney Disease.       ASSESSMENT AND PLAN  Chronic migraine without aura without status migrainosus, not intractable  Neck pain  Chronic tension-type headache, intractable     TPI bilateral splenius capitus muscles with 80 mg Depo-Medrol in Marcaine using sterile technique.  She tolerated the procedure well there were no complications. Change from Ajovy to [facility] one shot q4 weeks    ([id number] L 12/14/2024).  We will try to get this preauthorized for her.   Samples Nurtec 75mg   ([id number]  11/2026) Rtc 6  months.  Call sooner if new or worsening neurologic symptoms    Ananias Kolander A. Epimenio Foot, MD, [facility] 08/16/2023, 1:58 PM Certified in Neurology, Clinical Neurophysiology, Sleep Medicine and Neuroimaging  [facility] [address] [place], [place] [address] [phone]

## 2023-08-20 ENCOUNTER — Telehealth: Payer: Self-pay

## 2023-08-20 ENCOUNTER — Other Ambulatory Visit (HOSPITAL_COMMUNITY): Payer: Self-pay

## 2023-08-20 NOTE — Telephone Encounter (Signed)
 Pharmacy Patient Advocate Encounter   Received notification from CoverMyMeds that prior authorization for Emgality 120MG /ML auto-injectors (migraine) is required/requested.   Insurance verification completed.   The patient is insured through [facility] .   Per test claim: PA required; PA submitted to above mentioned insurance via CoverMyMeds Key/confirmation #/EOC [id number] Status is pending

## 2023-08-20 NOTE — Telephone Encounter (Signed)
 Pharmacy Patient Advocate Encounter   Received notification from CoverMyMeds that prior authorization for Nurtec 75MG  dispersible tablets is required/requested.   Insurance verification completed.   The patient is insured through [facility] .   Per test claim: PA required; PA submitted to above mentioned insurance via CoverMyMeds Key/confirmation #/EOC [id number] Status is pending

## 2023-08-23 NOTE — Telephone Encounter (Signed)
 Pharmacy Patient Advocate Encounter  Received notification from [facility] that Prior Authorization for Nurtec 75MG  dispersible tablets  has been APPROVED from 08/20/2023 to 08/19/2024   PA #/Case ID/Reference #: [id number]

## 2023-08-23 NOTE — Telephone Encounter (Signed)
 Pharmacy Patient Advocate Encounter  Received notification from [facility] that Prior Authorization for Emgality 120MG /ML auto-injectors (migraine)  has been APPROVED from 08/20/2023 to 11/20/2023   PA #/Case ID/Reference #: [id number]

## 2023-10-25 ENCOUNTER — Other Ambulatory Visit (HOSPITAL_COMMUNITY): Payer: Self-pay

## 2023-10-25 ENCOUNTER — Telehealth: Payer: Self-pay

## 2023-10-25 NOTE — Telephone Encounter (Signed)
 PT has not been evaluated since starting Emgality -insurance is requesting documentation on how the medication is working for the PT-please see below and please advise-Thanks!

## 2023-10-25 NOTE — Telephone Encounter (Signed)
 LVM for pt to call office

## 2023-10-25 NOTE — Telephone Encounter (Signed)
 Pharmacy Patient Advocate Encounter   Received notification from CoverMyMeds that prior authorization for Emgality  120MG /ML auto-injectors (migraine) is required/requested.   Insurance verification completed.   The patient is insured through [facility] .   Per test claim: PA required; PA started via CoverMyMeds. KEY [id number] . Waiting for clinical questions to populate.

## 2023-10-26 NOTE — Telephone Encounter (Signed)
 Clinical questions have been answered and PA submitted. PA currently Pending. Please be advised that most companies allow up to 30 days to make a decision. We will advise when a determination has been made, or follow up in 1 week.   Please reach out to our team, Rx Prior Auth Pool, if you haven't heard back in a week.

## 2023-10-26 NOTE — Telephone Encounter (Signed)
 LVM for pt to call.

## 2023-10-26 NOTE — Telephone Encounter (Signed)
 Pt called stating that yes the Emgality  is helping much better than the first one, but still is having a red rash/swollen for 3-4 days, she took a pic to show at next appt

## 2023-10-26 NOTE — Telephone Encounter (Signed)
 Answer is yes, thank you

## 2023-10-26 NOTE — Telephone Encounter (Addendum)
 Pt has returned call to RN, Odie Benne, Rn asked how many migraines per month she is having on emgality  right now?Pt said none since on the new medication, she may have had a slight headache but nothing like before

## 2023-10-28 ENCOUNTER — Other Ambulatory Visit (HOSPITAL_COMMUNITY): Payer: Self-pay

## 2023-10-28 NOTE — Telephone Encounter (Signed)
 Pharmacy Patient Advocate Encounter  Received notification from [facility] that Prior Authorization for Emgality  120MG /ML auto-injectors (migraine) has been APPROVED from 10/26/2023 to 10/25/2024   PA #/Case ID/Reference #: N/A

## 2023-12-06 ENCOUNTER — Telehealth: Payer: Self-pay

## 2023-12-06 ENCOUNTER — Telehealth: Payer: Self-pay | Admitting: Neurology

## 2023-12-06 ENCOUNTER — Other Ambulatory Visit (HOSPITAL_COMMUNITY): Payer: Self-pay

## 2023-12-06 MED ORDER — EMGALITY 120 MG/ML ~~LOC~~ SOAJ: SUBCUTANEOUS | 0 refills | Status: DC

## 2023-12-06 NOTE — Telephone Encounter (Signed)
 New Rx sent to pharmacy.   The address and [facility] [place], not [facility].

## 2023-12-06 NOTE — Telephone Encounter (Signed)
 Pharmacy Patient Advocate Encounter   Received notification from CoverMyMeds that prior authorization for Emgality  120MG /ML auto-injectors (migraine) is required/requested.   Insurance verification completed.   The patient is insured through [facility] .   Per test claim: PA required; PA submitted to above mentioned insurance via CoverMyMeds Key/confirmation #/EOC [id number] Status is pending

## 2023-12-06 NOTE — Telephone Encounter (Signed)
 Patient's Cindy Harmon request patient's refill for Galcanezumab -gnlm (EMGALITY ) 120 MG/ML SOAJ send to  [facility] [id number] [place], [place] [address] Phone: [phone]   Out of town asking one time refill to above pharmacy.

## 2023-12-08 NOTE — Telephone Encounter (Signed)
 Hi Monica I just received a call from the patient in regards to her refill. They are on vacation and concerned that the approval won't go through before the holiday. I just advised I would reach out to you to see if it was possible to get complete for tomorrow. Thank you

## 2023-12-09 ENCOUNTER — Other Ambulatory Visit (HOSPITAL_COMMUNITY): Payer: Self-pay

## 2023-12-09 NOTE — Telephone Encounter (Addendum)
 I checked on covermymeds to see if there had been an update. Received the following message:     I called number above. Spoke w/ Jori. Criteria questions not answered on case, case auto-closed yesterday from what she can see. I did relay on our end it appears questions submitted and pending decision. She looked further into case.  Re-ran test claim and received paid claim. Qty 1 per 28 days. Approval on file since 10/26/23 until 10/25/24.    [facility] at [phone]. Spoke w/ Mallie. Getting paid claim, 47.00. Out of stock. Will not arrive until 12/13/23. Automated message sent to pt around 1030am to notify her of this. Closest store that has in stock is [facility] [place] [place]. [facility]S in [place] on [address]. She recommends [place] location as [employer] will be very busy right now.  I called pt at [phone]. Wanting to know if she prefers to wait until current pharmacy has med in stock or send to alternate pharmacy above. LVM for her to call office.

## 2023-12-09 NOTE — Telephone Encounter (Signed)
 I tried to call [facility] to follow up on PA-they are closed-possibly due to [place]. I was able to leave a message on the emergency PA line-unsure of when I will get a call back or a response.

## 2023-12-09 NOTE — Telephone Encounter (Signed)
 Took call from phone room and spoke to husband. Relayed options. She is due for next injection 12/11/23. They will wait for med to be in stock 12/13/23 at [facility] and will f/u with them to get refill Nothing further needed at this time.

## 2023-12-20 ENCOUNTER — Telehealth: Payer: Self-pay | Admitting: Neurology

## 2023-12-20 DIAGNOSIS — G43709 Chronic migraine without aura, not intractable, without status migrainosus: Secondary | ICD-10-CM

## 2023-12-20 MED ORDER — EMGALITY 120 MG/ML ~~LOC~~ SOAJ: 1.0000 mL | SUBCUTANEOUS | 0 refills | Status: AC

## 2023-12-20 NOTE — Telephone Encounter (Signed)
 Called pharmacy and spoke w/ tech who stated we can send one time fill and put in notes for them to allow override/early fill d/t pt going out of country.    I called husband at [phone]. Leaving for trip on 01/08/24. Last day in [place], [place] 01/07/24. Will return 01/22/24.   Would like rx sent now to make sure they have in hand before leaving for trip. Aware I will speak with MD. If he approves, I will send in. If not, I will call back. He verbalized understanding.  Pharmacy: [id number] [place], [place] - [address]   Relayed below info:  EMGALITY  may be stored out of the refrigerator in the original carton at temperatures up to 72F (30C) for up to 7 days. After storing out of the refrigerator, do not place EMGALITY  back in the refrigerator.

## 2023-12-20 NOTE — Telephone Encounter (Signed)
 Pt last refilled Emgality  12/13/23 qty 1ml.   Called [facility] [place], [place] at [phone]. Pharmacy closed for lunch until 2pm

## 2023-12-20 NOTE — Telephone Encounter (Signed)
 Pt husband called to request medication be release ealier .Husband explain family will be leaving country before Pt nest injection and wants to know if MD can order another injection for Pt . Husband states they will be leaving Aug 1st  2025 .  Galcanezumab -gnlm (EMGALITY ) 120 MG/ML SOAJ     [id number] [place], [place] - [address] (Ph: [phone])

## 2023-12-20 NOTE — Telephone Encounter (Signed)
 Spoke w/ Dr. Vear. He approved sending early refill. I e-scribed to pharmacy.

## 2023-12-28 ENCOUNTER — Telehealth: Payer: Self-pay | Admitting: Neurology

## 2023-12-28 NOTE — Telephone Encounter (Signed)
 [facility] at [phone]. Spoke w/ pharmacist. States they have rx we sent and they are able to fill cash price early but insurance will not pay for early fill. Pt and/or husband needs to call member services number for insurance and ask for vacation override in order to fill prescription early and have insurance cover.   Khadijah LVM already for husband, waiting on call back.  Phone room: Please relay above to husband if he calls

## 2023-12-28 NOTE — Telephone Encounter (Signed)
 Pt's husband called stating that they will be going out of town on 8/2 and they are needing to fill the pt's Galcanezumab -gnlm (EMGALITY ) 120 MG/ML SOAJ but are not able to due to the pharmacy informing them that it is too soon for a fill. They would like to know what they can do so that they can take her medication with them on the trip. Please advise.

## 2023-12-28 NOTE — Telephone Encounter (Signed)
 LVM for pt's husband to call back to discuss pt's script.

## 2023-12-29 NOTE — Telephone Encounter (Addendum)
 Called husband at [phone] and provided update. He will call insurance and then f/u with pharmacy after speaking with insurance for vacation override. He will call back if any further questions arise.

## 2024-01-26 ENCOUNTER — Telehealth: Payer: Self-pay | Admitting: Pharmacist

## 2024-01-26 ENCOUNTER — Other Ambulatory Visit (HOSPITAL_COMMUNITY): Payer: Self-pay

## 2024-01-26 NOTE — Telephone Encounter (Signed)
 Pharmacy Patient Advocate Encounter   Received notification from Patient Pharmacy that prior authorization for Emgality  120MG /ML auto-injectors (migraine) is required/requested.   Insurance verification completed.   The patient is insured through [facility] .   Per test claim: PA is not needed at this time.  Current PA approval on file is good through 12/07/2024

## 2024-02-01 ENCOUNTER — Other Ambulatory Visit: Payer: Self-pay | Admitting: Neurology

## 2024-03-14 ENCOUNTER — Other Ambulatory Visit: Payer: Self-pay | Admitting: Obstetrics and Gynecology

## 2024-03-14 DIAGNOSIS — Z1231 Encounter for screening mammogram for malignant neoplasm of breast: Secondary | ICD-10-CM

## 2024-03-16 ENCOUNTER — Ambulatory Visit: Payer: Self-pay | Admitting: Neurology

## 2024-03-22 ENCOUNTER — Telehealth: Payer: Self-pay

## 2024-03-22 ENCOUNTER — Other Ambulatory Visit (HOSPITAL_COMMUNITY): Payer: Self-pay

## 2024-03-22 ENCOUNTER — Telehealth: Payer: Self-pay | Admitting: Neurology

## 2024-03-22 NOTE — Telephone Encounter (Signed)
 Pharmacy Patient Advocate Encounter   Received notification from Physician's Office that prior authorization for Emgality  is required/requested.   Insurance verification completed.   The patient is insured through [employer].   Per test claim: PA required; PA submitted to above mentioned insurance via Latent Key/confirmation #/EOC [id number] Status is pending

## 2024-03-22 NOTE — Telephone Encounter (Signed)
 Pt called stating that she is needing a PA for her Emgality . Pt has switched ins coverage that started this month. Cindy Harmon ID# [id number]  Group# [id number] BIN# [id number] Provider Line [phone]  Pt is due for her next inj on the 25th of this month.  Please advise.

## 2024-03-23 ENCOUNTER — Other Ambulatory Visit (HOSPITAL_COMMUNITY): Payer: Self-pay

## 2024-03-23 NOTE — Telephone Encounter (Signed)
 Pharmacy Patient Advocate Encounter  Received notification from [employer] that Prior Authorization for Emgality  has been APPROVED from 03/23/2024 to 03/23/2025. Ran test claim, Copay is $35.00. This test claim was processed through [facility]- copay amounts may vary at other pharmacies due to pharmacy/plan contracts, or as the patient moves through the different stages of their insurance plan.   PA #/Case ID/Reference #: [id number]

## 2024-06-13 ENCOUNTER — Ambulatory Visit
Admission: RE | Admit: 2024-06-13 | Discharge: 2024-06-13 | Disposition: A | Discharge status: Home / Self Care | Source: Ambulatory Visit | Attending: Physical Medicine and Rehabilitation | Admitting: Physical Medicine and Rehabilitation

## 2024-06-13 DIAGNOSIS — M549 Dorsalgia, unspecified: Secondary | ICD-10-CM

## 2024-10-03 ENCOUNTER — Ambulatory Visit: Payer: Self-pay | Admitting: Neurology
